# Patient Record
Sex: Female | Born: 1960 | Hispanic: No | Smoking: Current every day smoker
Health system: Southern US, Community
[De-identification: ages and names within clinical notes are randomized; demographics above are authoritative.]

## PROBLEM LIST (undated history)

## (undated) DIAGNOSIS — K5792 Diverticulitis of intestine, part unspecified, without perforation or abscess without bleeding: Secondary | ICD-10-CM

## (undated) DIAGNOSIS — E7800 Pure hypercholesterolemia, unspecified: Secondary | ICD-10-CM

---

## 2017-03-22 ENCOUNTER — Emergency Department (HOSPITAL_COMMUNITY)
Admission: EM | Admit: 2017-03-22 | Discharge: 2017-03-22 | Disposition: A | Discharge status: Home / Self Care | Payer: BLUE CROSS/BLUE SHIELD | Attending: Emergency Medicine | Admitting: Emergency Medicine

## 2017-03-22 ENCOUNTER — Encounter (HOSPITAL_COMMUNITY): Payer: Self-pay | Admitting: Emergency Medicine

## 2017-03-22 ENCOUNTER — Emergency Department (HOSPITAL_COMMUNITY): Payer: BLUE CROSS/BLUE SHIELD

## 2017-03-22 DIAGNOSIS — R1030 Lower abdominal pain, unspecified: Secondary | ICD-10-CM | POA: Diagnosis present

## 2017-03-22 DIAGNOSIS — K5732 Diverticulitis of large intestine without perforation or abscess without bleeding: Secondary | ICD-10-CM

## 2017-03-22 LAB — COMPREHENSIVE METABOLIC PANEL
ALBUMIN: 3.6 g/dL (ref 3.5–5.0)
ALK PHOS: 76 U/L (ref 38–126)
ALT: 17 U/L (ref 14–54)
AST: 17 U/L (ref 15–41)
Anion gap: 9 (ref 5–15)
BILIRUBIN TOTAL: 0.6 mg/dL (ref 0.3–1.2)
BUN: 7 mg/dL (ref 6–20)
CALCIUM: 8.8 mg/dL — AB (ref 8.9–10.3)
CO2: 25 mmol/L (ref 22–32)
CREATININE: 0.82 mg/dL (ref 0.44–1.00)
Chloride: 104 mmol/L (ref 101–111)
GFR calc Af Amer: >60 mL/min (ref >60)
Glucose, Bld: 109 mg/dL — ABNORMAL HIGH (ref 65–99)
Potassium: 3.4 mmol/L — ABNORMAL LOW (ref 3.5–5.1)
Sodium: 138 mmol/L (ref 135–145)
Total Protein: 6.4 g/dL — ABNORMAL LOW (ref 6.5–8.1)

## 2017-03-22 LAB — URINALYSIS, ROUTINE W REFLEX MICROSCOPIC
Glucose, UA: NEGATIVE mg/dL
HGB URINE DIPSTICK: NEGATIVE
Ketones, ur: 15 mg/dL — AB
Leukocytes, UA: NEGATIVE
NITRITE: NEGATIVE
PROTEIN: NEGATIVE mg/dL
Specific Gravity, Urine: >1.03 — ABNORMAL HIGH (ref 1.005–1.030)
pH: 5.5 (ref 5.0–8.0)

## 2017-03-22 LAB — LIPASE, BLOOD: LIPASE: 14 U/L (ref 11–51)

## 2017-03-22 LAB — CBC WITH DIFFERENTIAL/PLATELET
BASOS ABS: 0 10*3/uL (ref 0.0–0.1)
Basophils Relative: 0 %
Eosinophils Absolute: 0.1 10*3/uL (ref 0.0–0.7)
Eosinophils Relative: 1 %
HEMATOCRIT: 43.1 % (ref 36.0–46.0)
HEMOGLOBIN: 14.6 g/dL (ref 12.0–15.0)
LYMPHS PCT: 22 %
Lymphs Abs: 1.9 10*3/uL (ref 0.7–4.0)
MCH: 29.7 pg (ref 26.0–34.0)
MCHC: 33.9 g/dL (ref 30.0–36.0)
MCV: 87.8 fL (ref 78.0–100.0)
MONO ABS: 0.6 10*3/uL (ref 0.1–1.0)
Monocytes Relative: 7 %
NEUTROS ABS: 6.2 10*3/uL (ref 1.7–7.7)
NEUTROS PCT: 70 %
Platelets: 215 10*3/uL (ref 150–400)
RBC: 4.91 MIL/uL (ref 3.87–5.11)
RDW: 13.7 % (ref 11.5–15.5)
WBC: 8.9 10*3/uL (ref 4.0–10.5)

## 2017-03-22 IMAGING — CT CT ABD-PELV W/ CM
2 of 5 series · 10 of 46 positions shown, 11 images · IV contrast (iopamidol)
Comparison: [DATE] and [DATE]

CLINICAL DATA: Bilateral lower abdominal pain and diarrhea 1 day.
Diverticulitis [DATE].

EXAM:
CT ABDOMEN AND PELVIS WITH CONTRAST
TECHNIQUE: Multidetector CT imaging of the abdomen and pelvis was performed
using the standard protocol following bolus administration of
intravenous contrast.
CONTRAST:  100mL [OO] IOPAMIDOL ([OO]) INJECTION 61%

[Series 201: routine, idose (2) · axial · 0.80mm/px · z∈[+534,+914]mm · 7 of 98 slices shown, 8 images]
[im 11/98  soft-tissue]
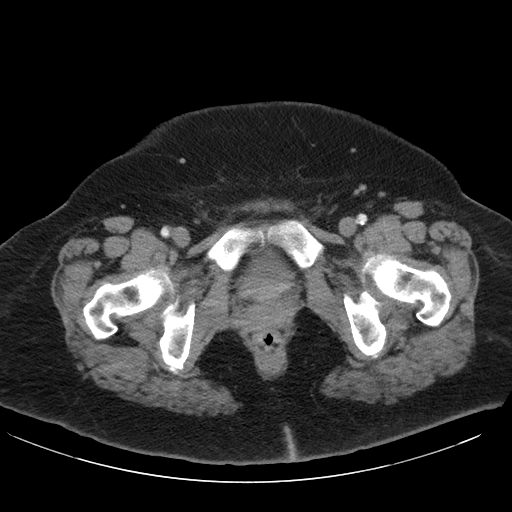
[im 11/98  bone]
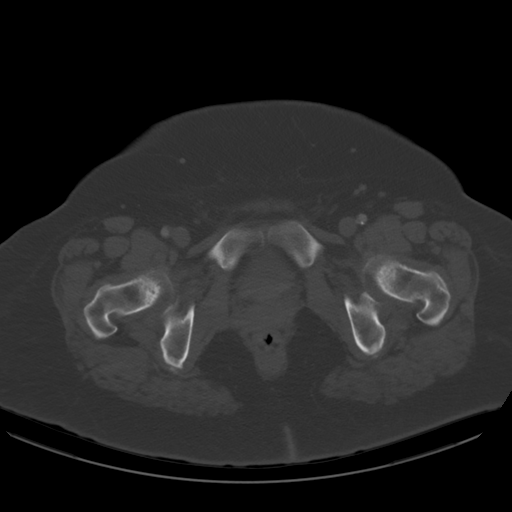
[im 21/98  soft-tissue]
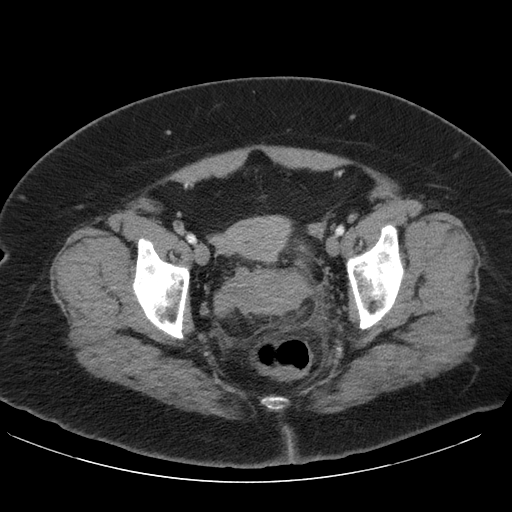
[im 36/98  soft-tissue]
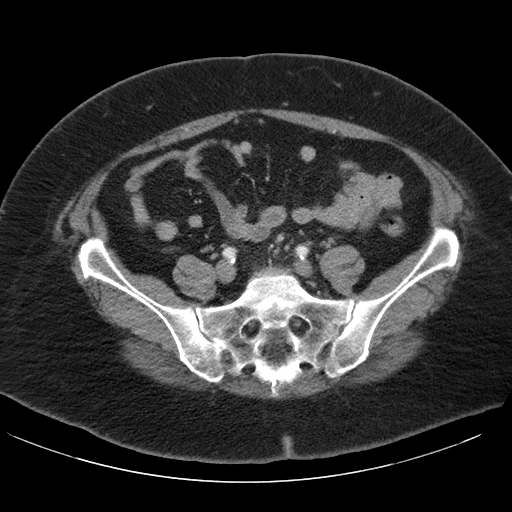
[im 52/98  soft-tissue]
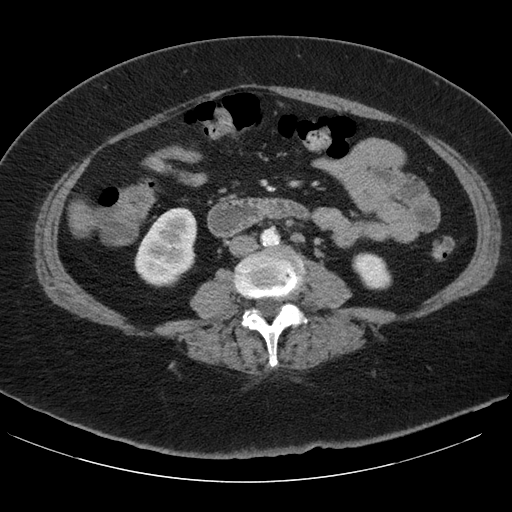
[im 62/98  soft-tissue]
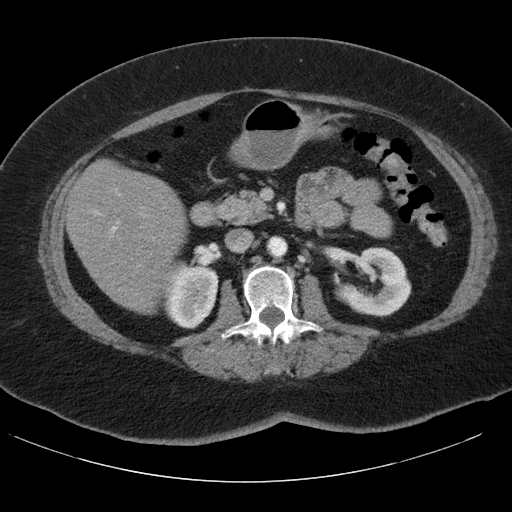
[im 77/98  soft-tissue]
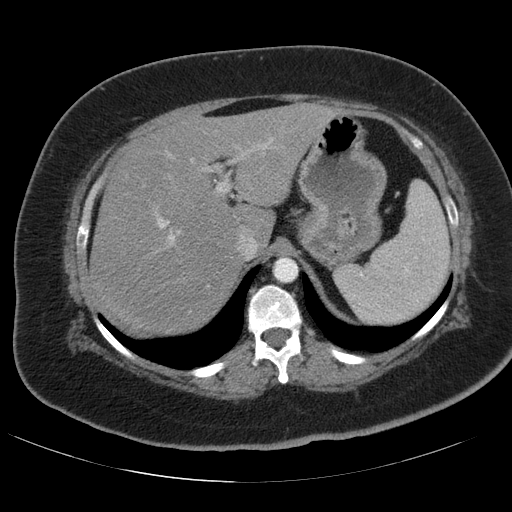
[im 87/98  soft-tissue]
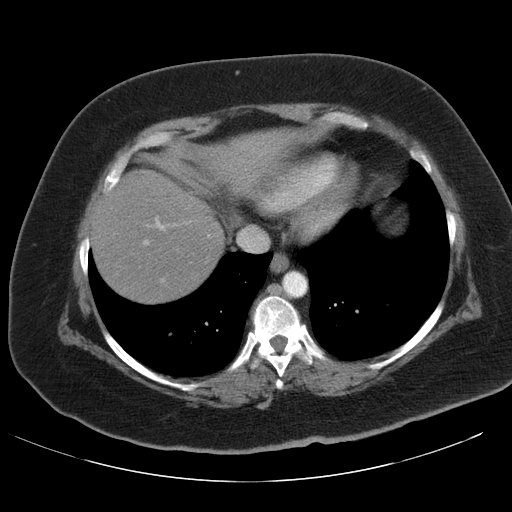

[Series 203: coronals, idose (2) · coronal · 0.45mm/px · 3 of 138 slices shown]
[im 46/138  soft-tissue]
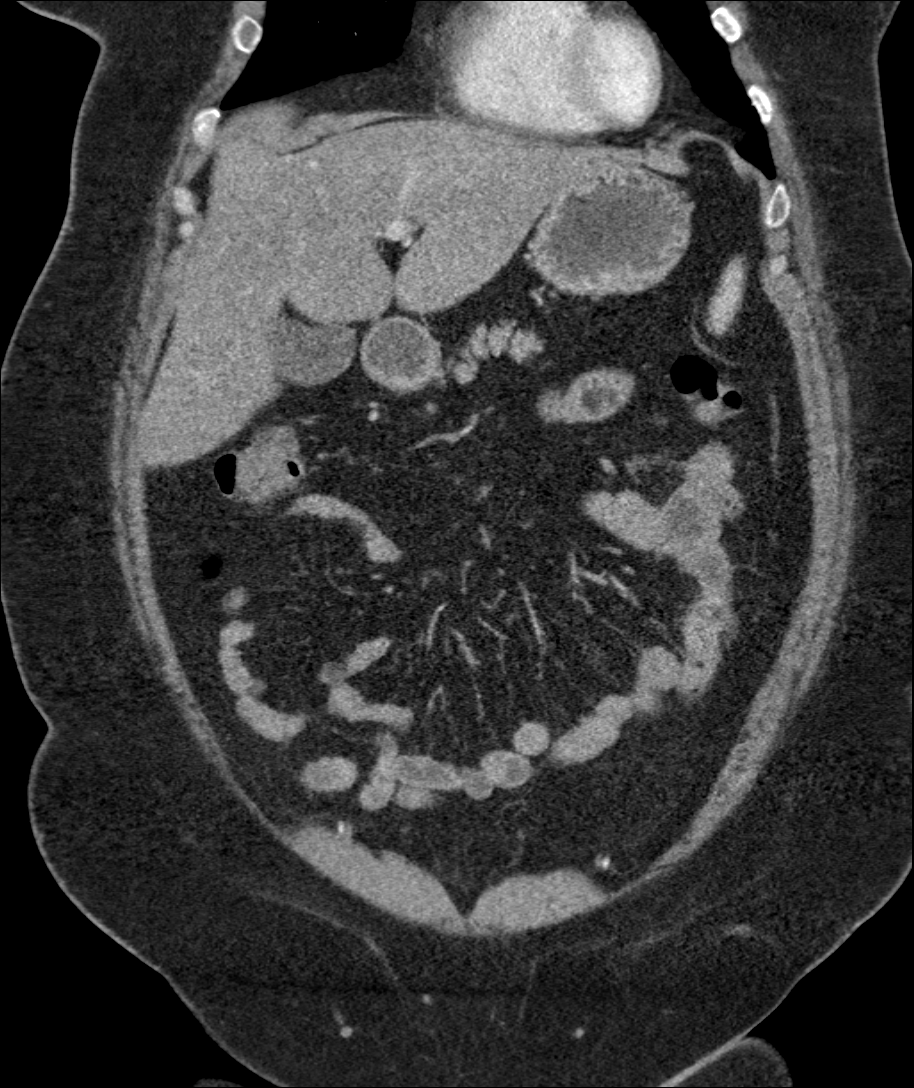
[im 61/138  soft-tissue]
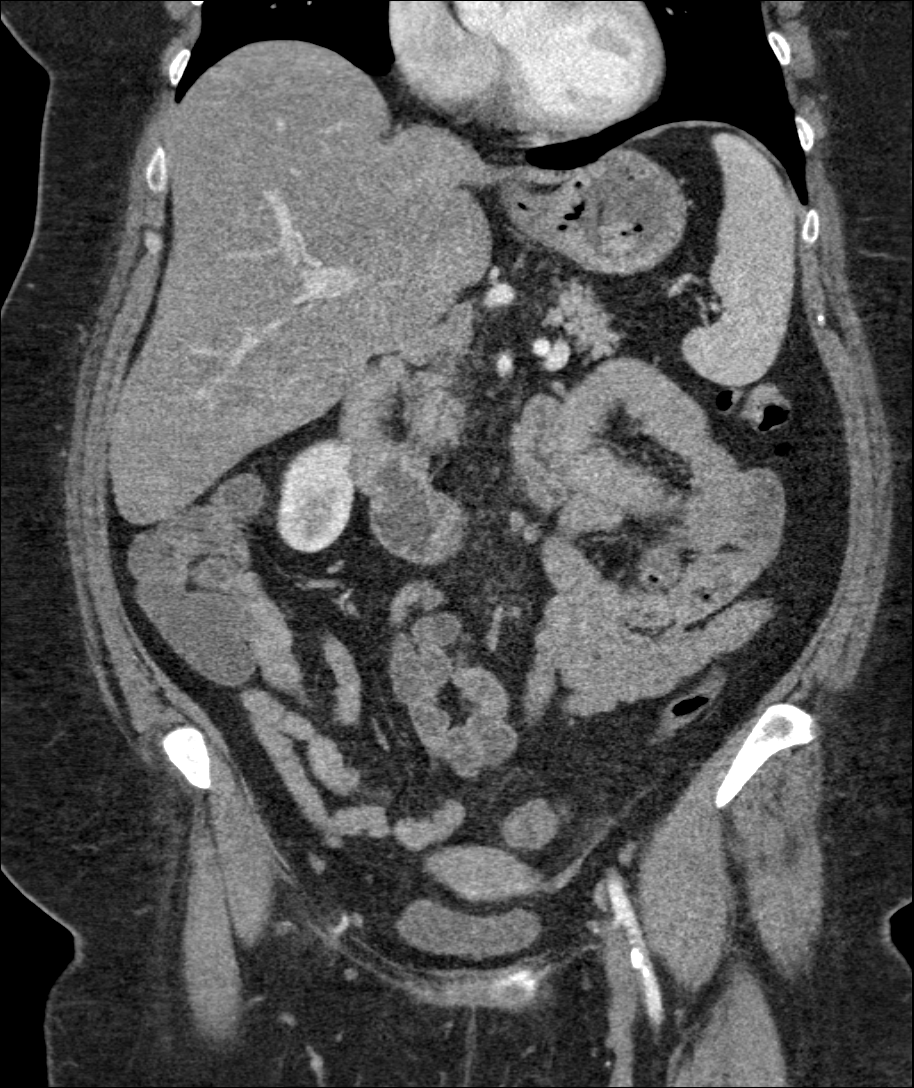
[im 77/138  soft-tissue]
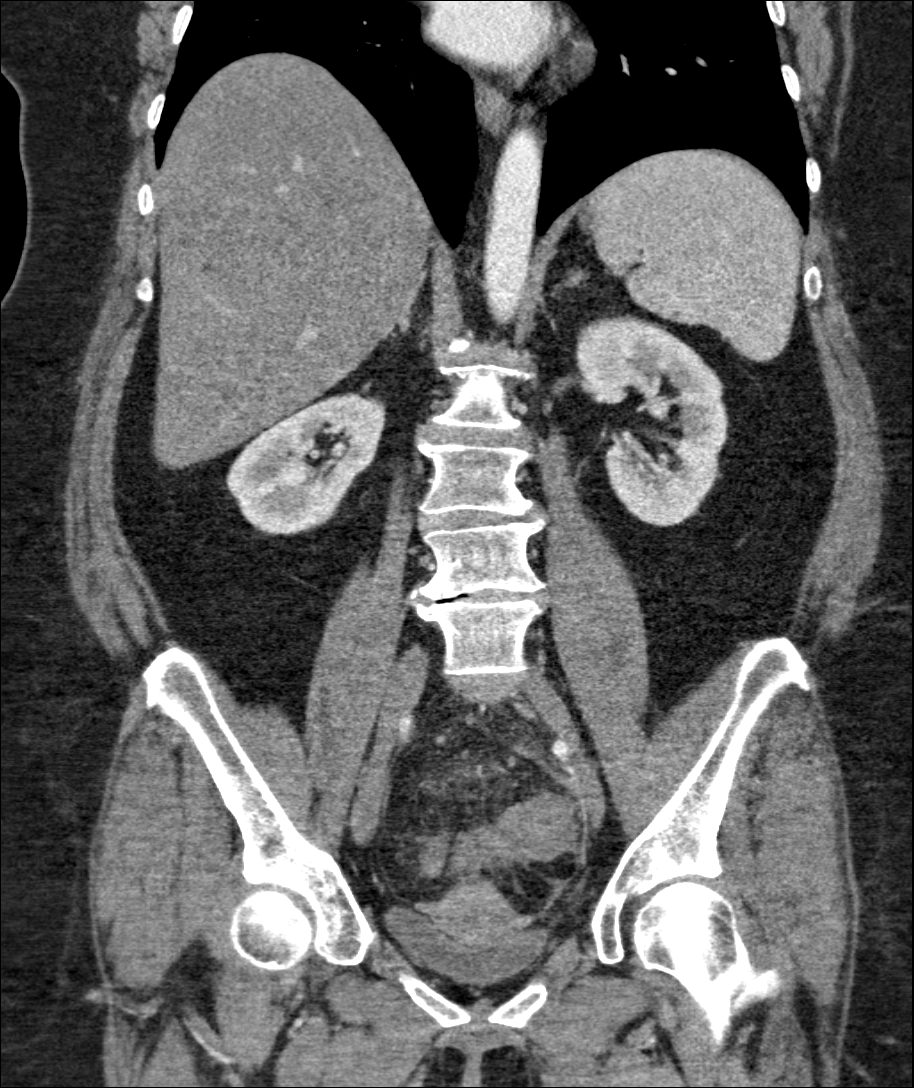

[10 of 46 positions shown; findings below may reference images not displayed]

FINDINGS: Lower chest: There are a few sub 4 mm peripheral nodular densities
over the right middle lobe and lower lobe without significant
change. Small calcified granuloma over the posterior right lower
lobe.

Hepatobiliary: There are a few subcentimeter hypodensities within
the liver unchanged likely cysts. Gallbladder and biliary tree are
within normal.

Pancreas: Within normal.

Spleen: Within normal per

Adrenals/Urinary Tract: Adrenal glands are normal. Kidneys normal
size without hydronephrosis or nephrolithiasis. There is a
subcentimeter hypodensity over the mid to lower pole right kidney
unchanged and likely a cyst. Ureters and bladder are within normal.

Stomach/Bowel: Stomach and small bowel are within normal. Appendix
is normal. Mild diverticulosis of the sigmoid colon with persistent
pericolonic inflammation and minimal free fluid adjacent a short
segment of the sigmoid colon in the midline pelvis. Findings are
compatible with persistent acute diverticulitis. Adjacent
inflammatory changes slightly worse. No definite abscess or
perforation. No adjacent adenopathy.

Vascular/Lymphatic: Subtle calcified plaque over the abdominal aorta
and iliac arteries. No significant adenopathy.

Reproductive: Within normal.

Other: None.

Musculoskeletal: Mild degenerate change of the spine and hips. Disc
disease from the L3-4 level to the L5-S1 level.
IMPRESSION: Persistent acute diverticulitis of the sigmoid colon in the midline
pelvis with slight worsening of adjacent inflammatory change/fluid.
No definite abscess or perforation.

Few subcentimeter liver hypodensities unchanged and likely cysts.

Right renal cyst unchanged.

Aortic atherosclerosis.

## 2017-03-22 MED ORDER — SODIUM CHLORIDE 0.9 % IV BOLUS (SEPSIS)
1000.0000 mL | Freq: Once | INTRAVENOUS | Status: AC
  Administered 2017-03-22: 1000 mL via INTRAVENOUS

## 2017-03-22 MED ORDER — MORPHINE SULFATE (PF) 4 MG/ML IV SOLN
4.0000 mg | Freq: Once | INTRAVENOUS | Status: AC
  Administered 2017-03-22: 4 mg via INTRAVENOUS
  Filled 2017-03-22: qty 1

## 2017-03-22 MED ORDER — OXYCODONE-ACETAMINOPHEN 5-325 MG PO TABS: 1.0000 | ORAL_TABLET | ORAL | 0 refills | Status: AC | PRN

## 2017-03-22 MED ORDER — ONDANSETRON HCL 4 MG/2ML IJ SOLN
4.0000 mg | Freq: Once | INTRAMUSCULAR | Status: AC
  Administered 2017-03-22: 4 mg via INTRAVENOUS
  Filled 2017-03-22: qty 2

## 2017-03-22 MED ORDER — IOPAMIDOL (ISOVUE-300) INJECTION 61%
INTRAVENOUS | Status: AC
  Administered 2017-03-22: 100 mL
  Filled 2017-03-22: qty 100

## 2017-03-22 MED ORDER — ONDANSETRON 4 MG PO TBDP: 4.0000 mg | ORAL_TABLET | Freq: Three times a day (TID) | ORAL | 0 refills | Status: AC | PRN

## 2017-03-22 MED ORDER — OXYCODONE-ACETAMINOPHEN 5-325 MG PO TABS
2.0000 | ORAL_TABLET | Freq: Once | ORAL | Status: AC
  Administered 2017-03-22: 2 via ORAL
  Filled 2017-03-22: qty 2

## 2017-03-22 NOTE — ED Notes (Signed)
Pt asked if she could have something to eat, PA stated pt could. Pt given coke to drink.

## 2017-03-22 NOTE — Discharge Instructions (Signed)
Take the prescribed medication as directed.  Do not drive while taking medication.  Continue your cipro/flagyl at home. Follow-up with your primary care doctor and/or your GI physician. Return to the ED for new or worsening symptoms.

## 2017-03-22 NOTE — ED Provider Notes (Signed)
[facility]-EMERGENCY DEPT Provider Note   CSN: [id number] Arrival date & time: 03/22/17  1053     History   Chief Complaint Chief Complaint  Patient presents with  . Abdominal Pain    HPI Samantha Lawrence is a 56 y.o. female.  The history is provided by the patient and medical records.  Abdominal Pain       56 year old female with history of diverticulitis, presenting to the ED for abdominal pain.  Patient reports this began about 5 days ago.  States lower abdominal, severe in nature.  Pain tolerable when curled on her side in the fetal position, worse with eating.  States she saw her PCP for this and was started on cipro/flagyl but feels her pain has been worsening for the past 2 days.  Husband reports she has mostly been in her bed for the past 2 days.  She denies fever but has had some chills.  No urinary symptoms.  No pelvic complaints.  No prior abdominal surgeries.  States she is due for colonoscopy soon.  History reviewed. No pertinent past medical history.  There are no active problems to display for this patient.   History reviewed. No pertinent surgical history.  OB History    No data available       Home Medications    Prior to Admission medications   Not on File    Family History No family history on file.  Social History Social History  Substance Use Topics  . Smoking status: Not on file  . Smokeless tobacco: Not on file  . Alcohol use Not on file     Allergies   Patient has no allergy information on record.   Review of Systems Review of Systems  Gastrointestinal: Positive for abdominal pain.  All other systems reviewed and are negative.    Physical Exam Updated Vital Signs BP 121/79 (BP Location: Right Arm)   Pulse 91   Temp 98.4 F (36.9 C) (Oral)   Resp 18   Ht  (1.702 m)   Wt 99.8 kg   SpO2 99%   BMI 34.46 kg/m   Physical Exam  Constitutional: She is oriented to person, place, and time. She appears well-developed and  well-nourished.  HENT:  Head: Normocephalic and atraumatic.  Mouth/Throat: Oropharynx is clear and moist.  Eyes: Conjunctivae and EOM are normal. Pupils are equal, round, and reactive to light.  Neck: Normal range of motion.  Cardiovascular: Normal rate, regular rhythm and normal heart sounds.   Pulmonary/Chest: Effort normal and breath sounds normal. No respiratory distress. She has no wheezes.  Abdominal: Soft. Bowel sounds are normal. There is tenderness.    Tenderness across lower abdomen, voluntary guarding, no peritoneal signs, bowel sounds normal, no distention noted  Musculoskeletal: Normal range of motion.  Neurological: She is alert and oriented to person, place, and time.  Skin: Skin is warm and dry.  Psychiatric: She has a normal mood and affect.  Nursing note and vitals reviewed.    ED Treatments / Results  Labs (all labs ordered are listed, but only abnormal results are displayed) Labs Reviewed  COMPREHENSIVE METABOLIC PANEL - Abnormal; Notable for the following:       Result Value   Potassium 3.4 (*)    Glucose, Bld 109 (*)    Calcium 8.8 (*)    Total Protein 6.4 (*)    All other components within normal limits  URINALYSIS, ROUTINE W REFLEX MICROSCOPIC - Abnormal; Notable for the following:  Specific Gravity, Urine >1.030 (*)    Bilirubin Urine SMALL (*)    Ketones, ur 15 (*)    All other components within normal limits  CBC WITH DIFFERENTIAL/PLATELET  LIPASE, BLOOD    EKG  EKG Interpretation None       Radiology Ct Abdomen Pelvis W Contrast  Result Date: 03/22/2017 CLINICAL DATA:  Bilateral lower abdominal pain and diarrhea 1 day. Diverticulitis December 2017. EXAM: CT ABDOMEN AND PELVIS WITH CONTRAST TECHNIQUE: Multidetector CT imaging of the abdomen and pelvis was performed using the standard protocol following bolus administration of intravenous contrast. CONTRAST:  ISOVUE-300 IOPAMIDOL (ISOVUE-300) INJECTION 61% COMPARISON:  12/03/2016 and  03/18/2017 FINDINGS: Lower chest: There are a few sub 4 mm peripheral nodular densities over the right middle lobe and lower lobe without significant change. Small calcified granuloma over the posterior right lower lobe. Hepatobiliary: There are a few subcentimeter hypodensities within the liver unchanged likely cysts. Gallbladder and biliary tree are within normal. Pancreas: Within normal. Spleen: Within normal per Adrenals/Urinary Tract: Adrenal glands are normal. Kidneys normal size without hydronephrosis or nephrolithiasis. There is a subcentimeter hypodensity over the mid to lower pole right kidney unchanged and likely a cyst. Ureters and bladder are within normal. Stomach/Bowel: Stomach and small bowel are within normal. Appendix is normal. Mild diverticulosis of the sigmoid colon with persistent pericolonic inflammation and minimal free fluid adjacent a short segment of the sigmoid colon in the midline pelvis. Findings are compatible with persistent acute diverticulitis. Adjacent inflammatory changes slightly worse. No definite abscess or perforation. No adjacent adenopathy. Vascular/Lymphatic: Subtle calcified plaque over the abdominal aorta and iliac arteries. No significant adenopathy. Reproductive: Within normal. Other: None. Musculoskeletal: Mild degenerate change of the spine and hips. Disc disease from the L3-4 level to the L5-S1 level. IMPRESSION: Persistent acute diverticulitis of the sigmoid colon in the midline pelvis with slight worsening of adjacent inflammatory change/fluid. No definite abscess or perforation. Few subcentimeter liver hypodensities unchanged and likely cysts. Right renal cyst unchanged. Aortic atherosclerosis. Electronically Signed   By: Elberta Fortis M.D.   On: 03/22/2017 13:41    Procedures Procedures (including critical care time)  Medications Ordered in ED Medications  oxyCODONE-acetaminophen (PERCOCET/ROXICET) 5-325 MG per tablet 2 tablet (not administered)  sodium  chloride 0.9 % bolus 1,000 mL (1,000 mLs Intravenous New Bag/Given 03/22/17 1157)  morphine 4 MG/ML injection 4 mg (4 mg Intravenous Given 03/22/17 1157)  ondansetron (ZOFRAN) injection 4 mg (4 mg Intravenous Given 03/22/17 1157)  iopamidol (ISOVUE-300) 61 % injection (100 mLs  Contrast Given 03/22/17 1311)     Initial Impression / Assessment and Plan / ED Course  I have reviewed the triage vital signs and the nursing notes.  Pertinent labs & imaging results that were available during my care of the patient were reviewed by me and considered in my medical decision making (see chart for details).  56 year old female here with lower abdominal pain. Has been ongoing for about a week now. Seen by her PCP and started on Cipro Flagyl a few days ago. Reports continued pain. She is afebrile and nontoxic in appearance. She does have some tenderness across lower abdomen. Voluntary guarding but no peritoneal signs. Labwork is overall reassuring. CT scan diverticulitis, however no complicating factors such as abscess or perforation. Patient has been tolerating oral fluids well here without issue. Her vitals remained stable. Will have her continue her Cipro/Flagyl at home. Will provide pain and nausea medications for symptomatic control. I recommended she follow-up closely with  her PCP and/or her GI physician.  Discussed plan with patient, she acknowledged understanding and agreed with plan of care.  Return precautions given for new or worsening symptoms.  Final Clinical Impressions(s) / ED Diagnoses   Final diagnoses:  Diverticulitis of large intestine without perforation or abscess without bleeding    New Prescriptions New Prescriptions   No medications on file     Garlon Hatchet, Cordelia Poche 03/22/17 1504    Charlynne Pander, MD 03/22/17 1511

## 2017-03-22 NOTE — ED Notes (Signed)
Pt ambulated to room from waiting room, tolerated well. Pt placed in gown. 

## 2017-03-22 NOTE — ED Triage Notes (Signed)
Pt reports lower abdomen pain since Wednesday night. Pt was placed on flagyl and Cipro since Thursday with no relief. Pt has history of diverticulosis.

## 2017-04-03 ENCOUNTER — Other Ambulatory Visit: Payer: Self-pay | Admitting: Family Medicine

## 2017-04-03 DIAGNOSIS — Z1231 Encounter for screening mammogram for malignant neoplasm of breast: Secondary | ICD-10-CM

## 2017-04-26 ENCOUNTER — Ambulatory Visit
Admission: RE | Admit: 2017-04-26 | Discharge: 2017-04-26 | Disposition: A | Discharge status: Home / Self Care | Payer: BLUE CROSS/BLUE SHIELD | Source: Ambulatory Visit | Attending: Family Medicine | Admitting: Family Medicine

## 2017-04-26 DIAGNOSIS — Z1231 Encounter for screening mammogram for malignant neoplasm of breast: Secondary | ICD-10-CM

## 2018-04-17 ENCOUNTER — Other Ambulatory Visit: Payer: Self-pay | Admitting: Family Medicine

## 2018-04-17 DIAGNOSIS — M8580 Other specified disorders of bone density and structure, unspecified site: Secondary | ICD-10-CM

## 2018-04-18 ENCOUNTER — Other Ambulatory Visit: Payer: Self-pay | Admitting: Family Medicine

## 2018-04-18 DIAGNOSIS — Z139 Encounter for screening, unspecified: Secondary | ICD-10-CM

## 2018-06-04 ENCOUNTER — Ambulatory Visit
Admission: RE | Admit: 2018-06-04 | Discharge: 2018-06-04 | Disposition: A | Discharge status: Home / Self Care | Payer: BLUE CROSS/BLUE SHIELD | Source: Ambulatory Visit | Attending: Family Medicine | Admitting: Family Medicine

## 2018-06-04 DIAGNOSIS — M8580 Other specified disorders of bone density and structure, unspecified site: Secondary | ICD-10-CM

## 2018-06-04 DIAGNOSIS — Z139 Encounter for screening, unspecified: Secondary | ICD-10-CM

## 2022-09-25 ENCOUNTER — Encounter (HOSPITAL_COMMUNITY): Payer: Self-pay

## 2022-09-25 ENCOUNTER — Emergency Department (HOSPITAL_COMMUNITY): Payer: BLUE CROSS/BLUE SHIELD

## 2022-09-25 ENCOUNTER — Emergency Department (HOSPITAL_COMMUNITY)
Admission: EM | Admit: 2022-09-25 | Discharge: 2022-09-26 | Disposition: A | Discharge status: Home / Self Care | Payer: BLUE CROSS/BLUE SHIELD | Attending: Emergency Medicine | Admitting: Emergency Medicine

## 2022-09-25 ENCOUNTER — Other Ambulatory Visit: Payer: Self-pay

## 2022-09-25 DIAGNOSIS — K5792 Diverticulitis of intestine, part unspecified, without perforation or abscess without bleeding: Secondary | ICD-10-CM | POA: Insufficient documentation

## 2022-09-25 DIAGNOSIS — R1032 Left lower quadrant pain: Secondary | ICD-10-CM | POA: Diagnosis present

## 2022-09-25 HISTORY — DX: Diverticulitis of intestine, part unspecified, without perforation or abscess without bleeding: K57.92

## 2022-09-25 HISTORY — DX: Pure hypercholesterolemia, unspecified: E78.00

## 2022-09-25 LAB — COMPREHENSIVE METABOLIC PANEL
ALT: 22 U/L (ref 0–44)
AST: 19 U/L (ref 15–41)
Albumin: 4 g/dL (ref 3.5–5.0)
Alkaline Phosphatase: 83 U/L (ref 38–126)
Anion gap: 10 (ref 5–15)
BUN: 9 mg/dL (ref 6–20)
CO2: 24 mmol/L (ref 22–32)
Calcium: 9.4 mg/dL (ref 8.9–10.3)
Chloride: 105 mmol/L (ref 98–111)
Creatinine, Ser: 0.85 mg/dL (ref 0.44–1.00)
GFR, Estimated: >60 mL/min (ref >60)
Glucose, Bld: 97 mg/dL (ref 70–99)
Potassium: 4.2 mmol/L (ref 3.5–5.1)
Sodium: 139 mmol/L (ref 135–145)
Total Bilirubin: 0.5 mg/dL (ref 0.3–1.2)
Total Protein: 7 g/dL (ref 6.5–8.1)

## 2022-09-25 LAB — URINALYSIS, ROUTINE W REFLEX MICROSCOPIC
Bacteria, UA: NONE SEEN
Bilirubin Urine: NEGATIVE
Glucose, UA: NEGATIVE mg/dL
Hgb urine dipstick: NEGATIVE
Ketones, ur: NEGATIVE mg/dL
Nitrite: NEGATIVE
Protein, ur: NEGATIVE mg/dL
Specific Gravity, Urine: 1.023 (ref 1.005–1.030)
pH: 5 (ref 5.0–8.0)

## 2022-09-25 LAB — CBC
HCT: 46.7 % — ABNORMAL HIGH (ref 36.0–46.0)
Hemoglobin: 15.7 g/dL — ABNORMAL HIGH (ref 12.0–15.0)
MCH: 30.3 pg (ref 26.0–34.0)
MCHC: 33.6 g/dL (ref 30.0–36.0)
MCV: 90.2 fL (ref 80.0–100.0)
Platelets: 188 10*3/uL (ref 150–400)
RBC: 5.18 MIL/uL — ABNORMAL HIGH (ref 3.87–5.11)
RDW: 13.8 % (ref 11.5–15.5)
WBC: 8.5 10*3/uL (ref 4.0–10.5)
nRBC: 0 % (ref 0.0–0.2)

## 2022-09-25 LAB — LIPASE, BLOOD: Lipase: 32 U/L (ref 11–51)

## 2022-09-25 LAB — I-STAT BETA HCG BLOOD, ED (MC, WL, AP ONLY): I-stat hCG, quantitative: <5 m[IU]/mL (ref <5)

## 2022-09-25 MED ORDER — IOHEXOL 350 MG/ML SOLN
75.0000 mL | Freq: Once | INTRAVENOUS | Status: AC | PRN
  Administered 2022-09-25: 75 mL via INTRAVENOUS

## 2022-09-25 NOTE — ED Triage Notes (Signed)
Patient complains of lower abd pain x 2 days that is a burning sensation.  Also complains of lower back pain.  Hx of diverticulitis.  Denies urinary symptoms.

## 2022-09-25 NOTE — ED Provider Triage Note (Signed)
Emergency Medicine Provider Triage Evaluation Note  Samantha Lawrence , a 61 y.o. female  was evaluated in triage.  Pt complains of lower abdominal pain x2 days.  Pain radiates to back.  History of diverticulitis.  Denies urinary or vaginal symptoms.  Review of Systems  Positive: Abdominal pain Negative: fever  Physical Exam  BP (!) 142/87 (BP Location: Right Arm)   Pulse 72   Temp 98.8 F (37.1 C)   Resp 16   Ht 5\' 7"  (1.702 m)   Wt 99.8 kg   SpO2 100%   BMI 34.46 kg/m  Gen:   Awake, no distress   Resp:  Normal effort  MSK:   Moves extremities without difficulty  Other:    Medical Decision Making  Medically screening exam initiated at 6:43 PM.  Appropriate orders placed.  Samantha Lawrence was informed that the remainder of the evaluation will be completed by another provider, this initial triage assessment does not replace that evaluation, and the importance of remaining in the ED until their evaluation is complete.  Labs CT abdomen   Karie Kirks 09/25/22 1844

## 2022-09-26 MED ORDER — CIPROFLOXACIN HCL 500 MG PO TABS: 500.0000 mg | ORAL_TABLET | Freq: Two times a day (BID) | ORAL | 0 refills | Status: AC

## 2022-09-26 MED ORDER — CIPROFLOXACIN HCL 500 MG PO TABS
500.0000 mg | ORAL_TABLET | Freq: Once | ORAL | Status: AC
  Administered 2022-09-26: 500 mg via ORAL
  Filled 2022-09-26: qty 1

## 2022-09-26 MED ORDER — METRONIDAZOLE 500 MG PO TABS: 500.0000 mg | ORAL_TABLET | Freq: Two times a day (BID) | ORAL | 0 refills | Status: AC

## 2022-09-26 MED ORDER — METRONIDAZOLE 500 MG PO TABS
500.0000 mg | ORAL_TABLET | Freq: Once | ORAL | Status: AC
  Administered 2022-09-26: 500 mg via ORAL
  Filled 2022-09-26: qty 1

## 2022-09-26 MED ORDER — ACETAMINOPHEN 325 MG PO TABS
650.0000 mg | ORAL_TABLET | Freq: Once | ORAL | Status: AC
  Administered 2022-09-26: 650 mg via ORAL
  Filled 2022-09-26: qty 2

## 2022-09-26 NOTE — Discharge Instructions (Addendum)
You were seen in the emergency department for your abdominal pain.  Your work-up shows that you have diverticulitis.  I have given you a prescription of antibiotics and you should complete this as prescribed.  You can take Tylenol as needed for pain.  You should follow-up with your primary doctor in the next few days to have your symptoms rechecked and you should follow-up with your GI doctor for repeat colonoscopy once your symptoms have resolved.  You should return to the emergency department if having significantly worsening pain, fevers, repetitive vomiting or any other new or concerning symptoms.

## 2022-09-26 NOTE — ED Provider Notes (Signed)
[facility] EMERGENCY DEPARTMENT Provider Note   CSN: [id number] Arrival date & time: 09/25/22  1738     History  Chief Complaint  Patient presents with   Abdominal Pain    Samantha Lawrence is a 61 y.o. female.  Patient is a 61 year old female with a past medical history of previous diverticulitis presenting to the emergency department with lower abdominal and back pain.  She states that her symptoms started on Sunday and that she had to go home from work on Monday due to the pain.  She states that she has had a burning type of pain that has increased in severity.  She denies any fevers or chills, nausea or vomiting, diarrhea or constipation, black or bloody stools.  She states that the abdominal pain feels similar to her prior diverticulitis but does not usually get back pain.  She denies any numbness or weakness, dysuria or hematuria.  She states she has not taken anything for pain at home.  She states that she does have a family history of colon cancer and has had multiple colonoscopies in the past but is currently due for a repeat colonoscopy.  The history is provided by the patient and the spouse.  Abdominal Pain      Home Medications Prior to Admission medications   Medication Sig Start Date End Date Taking? Authorizing Provider  ciprofloxacin (CIPRO) 500 MG tablet Take 1 tablet (500 mg total) by mouth every 12 (twelve) hours for 7 days. 09/26/22 10/03/22 Yes Theresia Lo, Benetta Spar K, DO  metroNIDAZOLE (FLAGYL) 500 MG tablet Take 1 tablet (500 mg total) by mouth 2 (two) times daily. 09/26/22  Yes Theresia Lo, [place] K, DO  ondansetron (ZOFRAN ODT) 4 MG disintegrating tablet Take 1 tablet (4 mg total) by mouth every 8 (eight) hours as needed for nausea. 03/22/17   Garlon Hatchet, PA-C  oxyCODONE-acetaminophen (PERCOCET) 5-325 MG tablet Take 1 tablet by mouth every 4 (four) hours as needed. 03/22/17   Garlon Hatchet, PA-C      Allergies    Cephalexin,  Clarithromycin, and Omeprazole    Review of Systems   Review of Systems  Gastrointestinal:  Positive for abdominal pain.    Physical Exam Updated Vital Signs BP (!) 141/91 (BP Location: Left Arm)   Pulse 64   Temp 97.8 F (36.6 C) (Oral)   Resp 18   Ht 5\' 7"  (1.702 m)   Wt 99.8 kg   SpO2 98%   BMI 34.46 kg/m  Physical Exam Vitals and nursing note reviewed.  Constitutional:      General: She is not in acute distress.    Appearance: She is well-developed. She is obese.  HENT:     Head: Normocephalic and atraumatic.  Eyes:     Extraocular Movements: Extraocular movements intact.  Cardiovascular:     Rate and Rhythm: Normal rate and regular rhythm.     Heart sounds: Normal heart sounds.  Pulmonary:     Effort: Pulmonary effort is normal.     Breath sounds: Normal breath sounds.  Abdominal:     General: Abdomen is flat.     Palpations: Abdomen is soft.     Tenderness: There is abdominal tenderness in the suprapubic area and left lower quadrant. There is no right CVA tenderness, left CVA tenderness, guarding or rebound.  Skin:    General: Skin is warm and dry.  Neurological:     General: No focal deficit present.     Mental Status: She  is alert and oriented to person, place, and time.     Motor: No weakness.  Psychiatric:        Behavior: Behavior normal.     ED Results / Procedures / Treatments   Labs (all labs ordered are listed, but only abnormal results are displayed) Labs Reviewed  CBC - Abnormal; Notable for the following components:      Result Value   RBC 5.18 (*)    Hemoglobin 15.7 (*)    HCT 46.7 (*)    All other components within normal limits  URINALYSIS, ROUTINE W REFLEX MICROSCOPIC - Abnormal; Notable for the following components:   APPearance CLOUDY (*)    Leukocytes,Ua TRACE (*)    All other components within normal limits  LIPASE, BLOOD  COMPREHENSIVE METABOLIC PANEL  I-STAT BETA HCG BLOOD, ED ([facility], [facility], [facility] ONLY)     EKG None  Radiology CT ABDOMEN PELVIS W CONTRAST  Result Date: 09/25/2022 CLINICAL DATA:  LLQ abdominal pain EXAM: CT ABDOMEN AND PELVIS WITH CONTRAST TECHNIQUE: Multidetector CT imaging of the abdomen and pelvis was performed using the standard protocol following bolus administration of intravenous contrast. RADIATION DOSE REDUCTION: This exam was performed according to the departmental dose-optimization program which includes automated exposure control, adjustment of the mA and/or kV according to patient size and/or use of iterative reconstruction technique. CONTRAST:  24mL OMNIPAQUE IOHEXOL 350 MG/ML SOLN COMPARISON:  CT abdomen pelvis 03/22/2017 FINDINGS: Lower chest: Couple stable scattered pulmonary micronodules within the right lower lobe. No acute abnormality. Hepatobiliary: The liver is enlarged measuring up to 21 cm. No focal liver abnormality. No gallstones, gallbladder wall thickening, or pericholecystic fluid. No biliary dilatation. Pancreas: No focal lesion. Normal pancreatic contour. No surrounding inflammatory changes. No main pancreatic ductal dilatation. Spleen: Normal in size without focal abnormality. Adrenals/Urinary Tract: No adrenal nodule bilaterally. Bilateral kidneys enhance symmetrically. Subcentimeter hypodensity too small to characterize. No hydronephrosis. No hydroureter. The urinary bladder is unremarkable. Stomach/Bowel: Stomach is within normal limits. No evidence of small bowel wall thickening or dilatation. Colonic diverticulosis. Mild circumferential bowel wall thickening of the mid to distal sigmoid colon and associated trace pericolonic fat stranding surrounding a diverticula (7:129). Appendix appears normal. Vascular/Lymphatic: No abdominal aorta or iliac aneurysm. Moderate atherosclerotic plaque of the aorta and its branches. No abdominal, pelvic, or inguinal lymphadenopathy. Reproductive: Uterus and bilateral adnexa are unremarkable. Other: No intraperitoneal  free fluid. No intraperitoneal free gas. No organized fluid collection. Musculoskeletal: Tiny fat containing umbilical hernia. No suspicious lytic or blastic osseous lesions. No acute displaced fracture. Multilevel degenerative changes of the spine. IMPRESSION: 1. Colonic diverticulosis with early/mild acute diverticulitis of the sigmoid colon. Recommend colonoscopy status post treatment and status post complete resolution of inflammatory changes to exclude an underlying lesion. 2. Hepatomegaly. 3.  Aortic Atherosclerosis (ICD10-I70.0). Electronically Signed   By: Tish Frederickson M.D.   On: 09/25/2022 23:06    Procedures Procedures    Medications Ordered in ED Medications  ciprofloxacin (CIPRO) tablet 500 mg (has no administration in time range)  metroNIDAZOLE (FLAGYL) tablet 500 mg (has no administration in time range)  acetaminophen (TYLENOL) tablet 650 mg (has no administration in time range)  iohexol (OMNIPAQUE) 350 MG/ML injection 75 mL (75 mLs Intravenous Contrast Given 09/25/22 2257)    ED Course/ Medical Decision Making/ A&P                           Medical Decision Making This patient presents to the  ED with chief complaint(s) of abdominal pain with pertinent past medical history of diverticulitis which further complicates the presenting complaint. The complaint involves an extensive differential diagnosis and also carries with it a high risk of complications and morbidity.    The differential diagnosis includes diverticulitis, UTI, pyelonephritis, nephrolithiasis, musculoskeletal pain, no history of any trauma making fracture unlikely, no focal neurologic deficits saddle anesthesia or fever or recent instrumentation making cauda equina or epidural abscess unlikely  Additional history obtained: Additional history obtained from spouse Records reviewed N/A  ED Course and Reassessment: Patient was initially evaluated by provider in triage and had labs, urine and CT performed.  Labs  are within normal range and urine is negative for UTI.  CT shows evidence of early diverticulitis.  She states she has been treated with Cipro and Flagyl in the past which has worked well for her with her cephalosporin allergy, urine and CT performed.  Labs are within normal range and urine is negative for UTI.  CT shows evidence of early diverticulitis.  She states she has been treated with Cipro and Flagyl in the past which has worked well for her with her cephalosporin allergy and she will be treated with this today.  She was given Tylenol for pain.  She was recommended primary care follow-up and GI follow-up once her diverticulitis has resolved  Independent labs interpretation:  The following labs were independently interpreted: Within normal range  Independent visualization of imaging: - I independently visualized the following imaging with scope of interpretation limited to determining acute life threatening conditions related to emergency care: CTaP, which revealed early acute diverticulitis  Consultation: - Consulted or discussed management/test interpretation w/ external professional: N/A  Consideration for admission or further workup: Patient has no emergent conditions requiring admission at this time and is stable for discharge with primary care and GI follow-up Social Determinants of health: N/A    Amount and/or Complexity of Data Reviewed Labs: ordered.  Risk OTC drugs. Prescription drug management.          Final Clinical Impression(s) / ED Diagnoses Final diagnoses:  Diverticulitis    Rx / DC Orders ED Discharge Orders          Ordered    metroNIDAZOLE (FLAGYL) 500 MG tablet  2 times daily        09/26/22 1206    ciprofloxacin (CIPRO) 500 MG tablet  Every 12 hours        09/26/22 1206              Kemper Durie, DO 09/26/22 1207

## 2022-09-26 NOTE — ED Notes (Signed)
DC instructions reviewed with pt. PT verbalized understanding. PT DC °

## 2025-01-28 ENCOUNTER — Ambulatory Visit: Admitting: Urology
